# Patient Record
Sex: Male | Born: 1965 | Race: White | Hispanic: No | Marital: Married | Smoking: Never smoker
Health system: Southern US, Community
[De-identification: ages and names within clinical notes are randomized; demographics above are authoritative.]

## PROBLEM LIST (undated history)

## (undated) DIAGNOSIS — E119 Type 2 diabetes mellitus without complications: Secondary | ICD-10-CM

## (undated) DIAGNOSIS — I10 Essential (primary) hypertension: Secondary | ICD-10-CM

## (undated) DIAGNOSIS — T7840XA Allergy, unspecified, initial encounter: Secondary | ICD-10-CM

## (undated) HISTORY — DX: Allergy, unspecified, initial encounter: T78.40XA

## (undated) HISTORY — PX: COLON SURGERY: SHX602

## (undated) HISTORY — PX: SMALL INTESTINE SURGERY: SHX150

---

## 1998-04-20 ENCOUNTER — Emergency Department (HOSPITAL_COMMUNITY): Admission: EM | Admit: 1998-04-20 | Discharge: 1998-04-20 | Payer: Self-pay

## 2004-03-22 DIAGNOSIS — G4730 Sleep apnea, unspecified: Secondary | ICD-10-CM

## 2004-03-22 DIAGNOSIS — Q430 Meckel's diverticulum (displaced) (hypertrophic): Secondary | ICD-10-CM

## 2004-03-22 HISTORY — DX: Meckel's diverticulum (displaced) (hypertrophic): Q43.0

## 2004-03-22 HISTORY — DX: Sleep apnea, unspecified: G47.30

## 2006-02-11 ENCOUNTER — Encounter (INDEPENDENT_AMBULATORY_CARE_PROVIDER_SITE_OTHER): Payer: Self-pay | Admitting: Specialist

## 2006-02-11 ENCOUNTER — Inpatient Hospital Stay (HOSPITAL_COMMUNITY): Admission: EM | Admit: 2006-02-11 | Discharge: 2006-02-17 | Payer: Self-pay | Admitting: Emergency Medicine

## 2007-01-30 ENCOUNTER — Emergency Department (HOSPITAL_COMMUNITY): Admission: EM | Admit: 2007-01-30 | Discharge: 2007-01-31 | Payer: Self-pay | Admitting: Emergency Medicine

## 2007-02-03 ENCOUNTER — Ambulatory Visit: Payer: Self-pay | Admitting: Emergency Medicine

## 2007-03-08 ENCOUNTER — Ambulatory Visit: Payer: Self-pay | Admitting: Emergency Medicine

## 2007-03-08 DIAGNOSIS — J309 Allergic rhinitis, unspecified: Secondary | ICD-10-CM | POA: Insufficient documentation

## 2007-03-08 DIAGNOSIS — G4710 Hypersomnia, unspecified: Secondary | ICD-10-CM | POA: Insufficient documentation

## 2007-03-08 DIAGNOSIS — G4730 Sleep apnea, unspecified: Secondary | ICD-10-CM

## 2007-03-08 DIAGNOSIS — R05 Cough: Secondary | ICD-10-CM

## 2007-03-29 ENCOUNTER — Ambulatory Visit (HOSPITAL_BASED_OUTPATIENT_CLINIC_OR_DEPARTMENT_OTHER): Admission: RE | Admit: 2007-03-29 | Discharge: 2007-03-29 | Payer: Self-pay | Admitting: Emergency Medicine

## 2007-03-29 ENCOUNTER — Ambulatory Visit: Payer: Self-pay | Admitting: Pulmonary Disease

## 2007-03-29 ENCOUNTER — Encounter: Payer: Self-pay | Admitting: Emergency Medicine

## 2007-04-19 ENCOUNTER — Ambulatory Visit: Payer: Self-pay | Admitting: Emergency Medicine

## 2007-04-27 ENCOUNTER — Encounter: Payer: Self-pay | Admitting: Emergency Medicine

## 2007-04-27 ENCOUNTER — Ambulatory Visit: Payer: Self-pay | Admitting: Pulmonary Disease

## 2007-04-27 ENCOUNTER — Ambulatory Visit (HOSPITAL_BASED_OUTPATIENT_CLINIC_OR_DEPARTMENT_OTHER): Admission: RE | Admit: 2007-04-27 | Discharge: 2007-04-27 | Payer: Self-pay | Admitting: Emergency Medicine

## 2007-05-03 ENCOUNTER — Encounter: Payer: Self-pay | Admitting: Emergency Medicine

## 2007-07-03 ENCOUNTER — Encounter: Payer: Self-pay | Admitting: Emergency Medicine

## 2007-08-01 ENCOUNTER — Encounter: Payer: Self-pay | Admitting: Emergency Medicine

## 2007-11-28 IMAGING — CR DG CHEST 2V
2 series · 2 of 2 positions shown · non-contrast
Comparison: None.

CLINICAL DATA: Shortness of breath and syncope.
 CHEST - 2 VIEW:

[w chest pa]
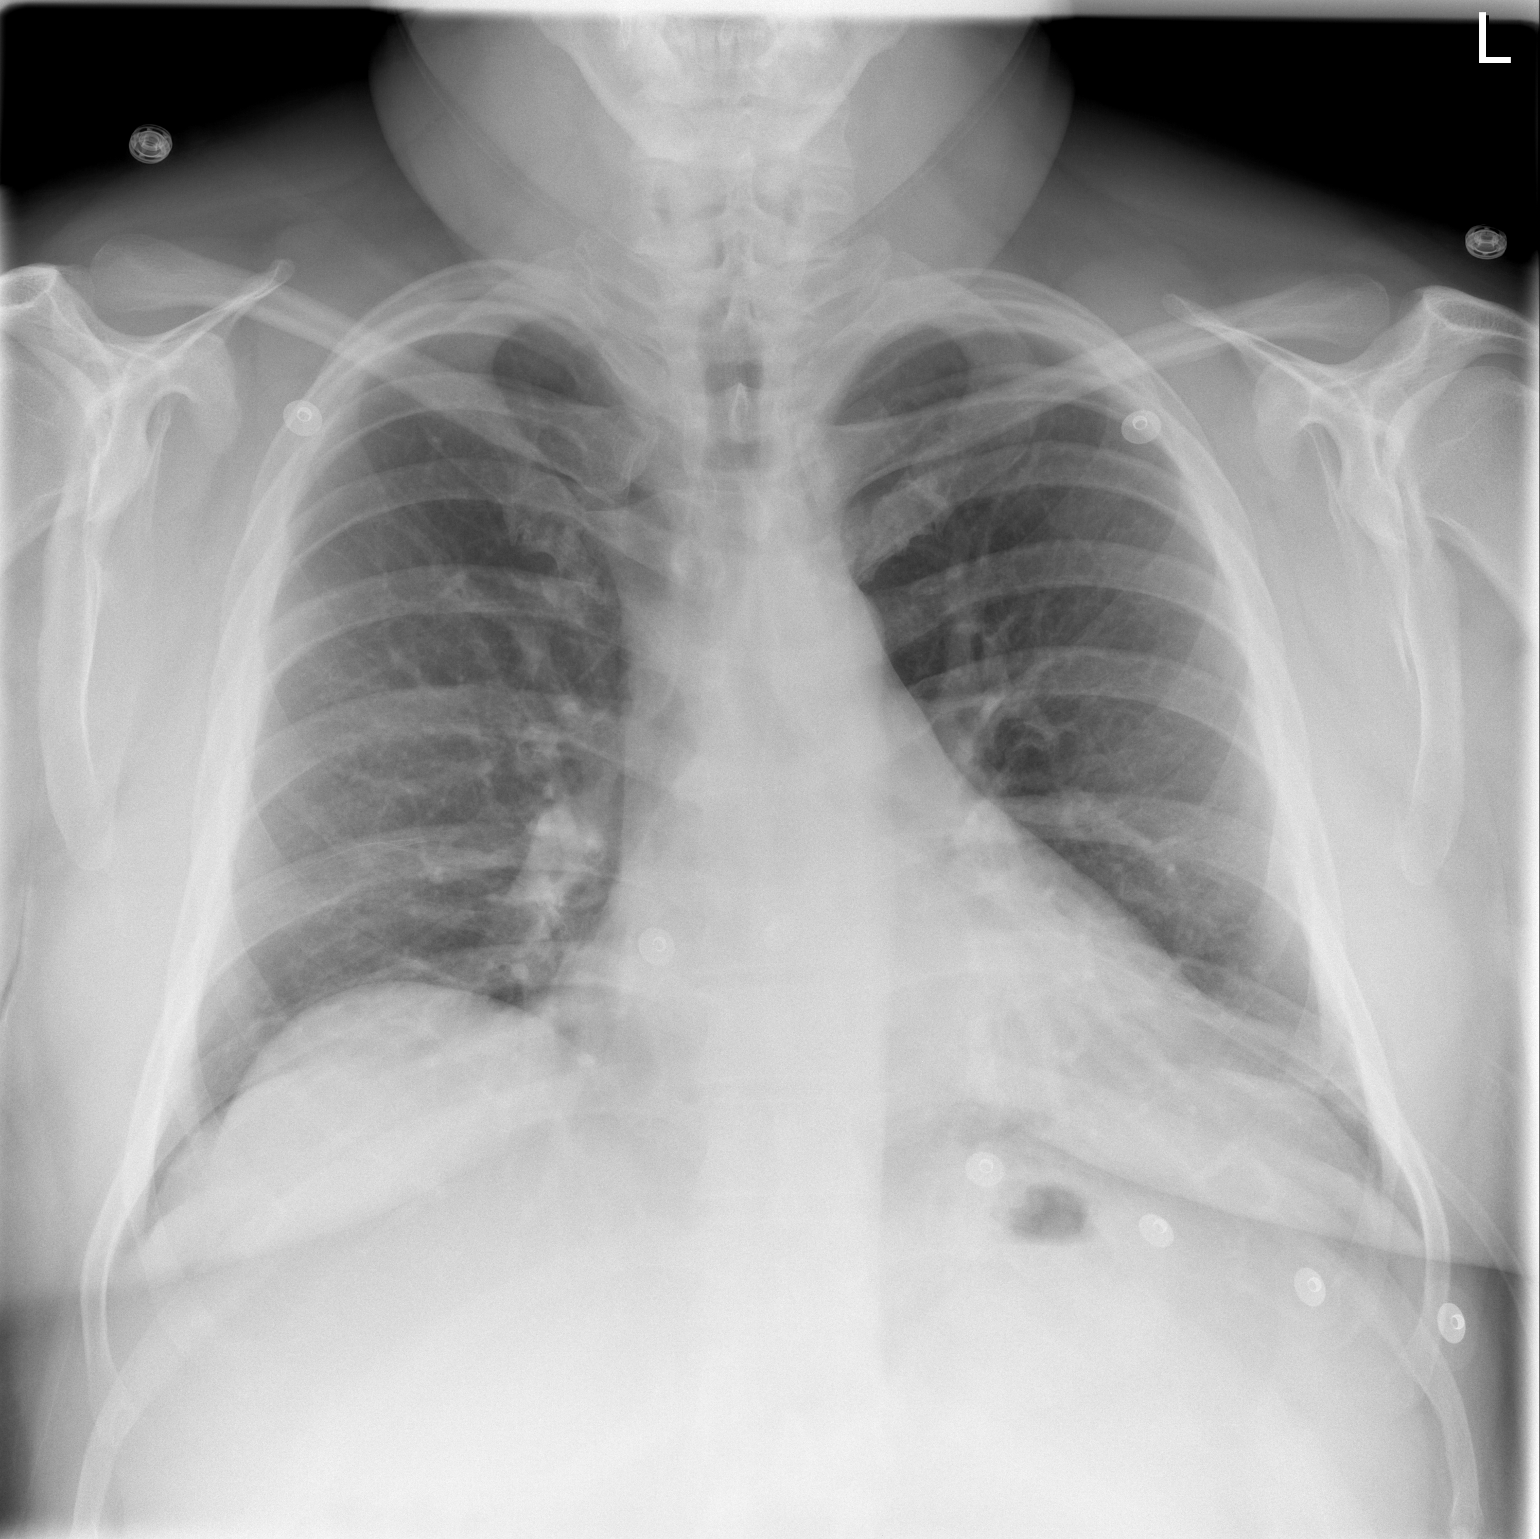

[w chest lat]
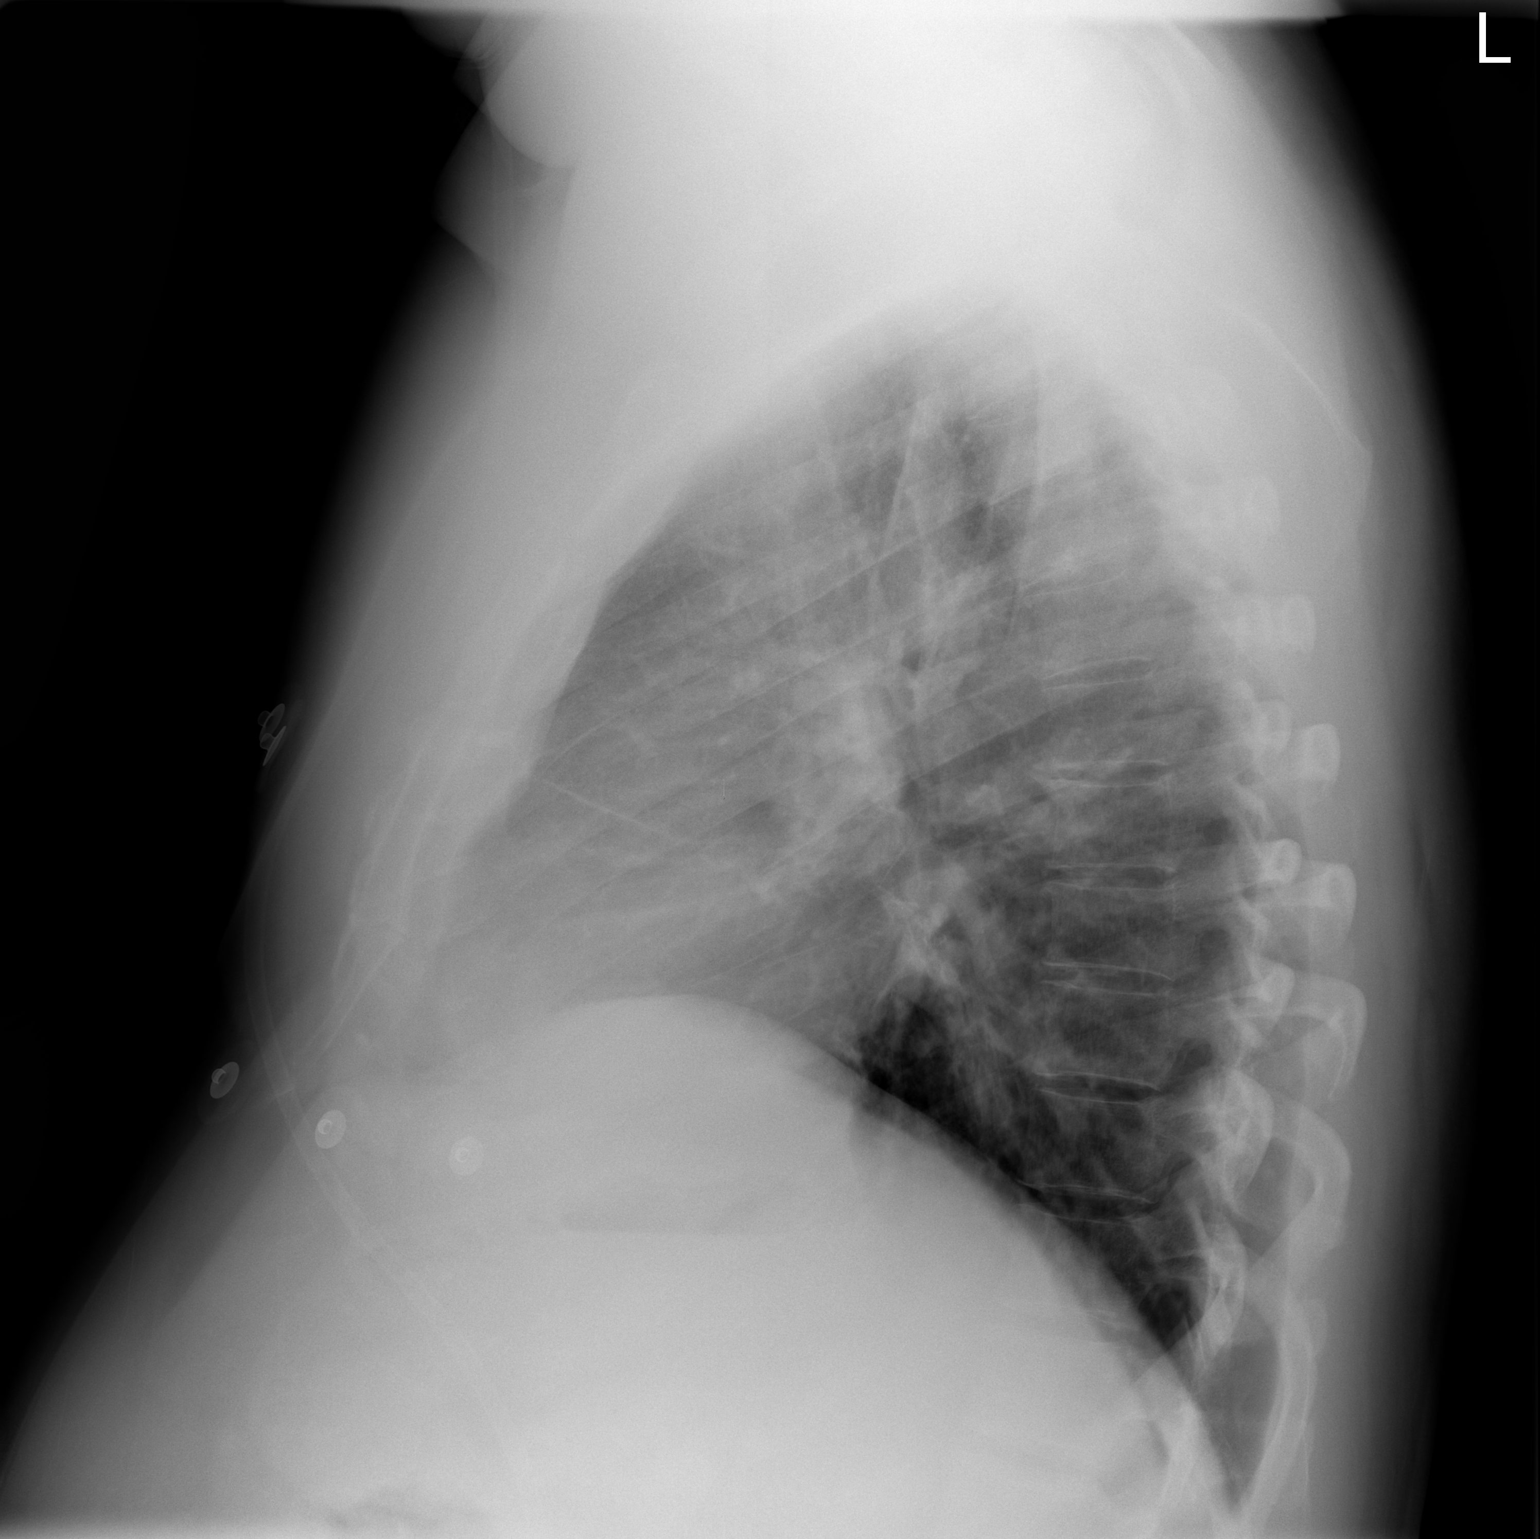

[2 of 2 positions shown; findings below may reference images not displayed]

FINDINGS: Lung volumes are low with mild basilar atelectasis.  No pleural effusion.  Heart size normal.  No focal bony abnormality.
IMPRESSION: Mild bibasilar atelectasis.  Otherwise negative.

## 2009-09-15 ENCOUNTER — Emergency Department (HOSPITAL_COMMUNITY): Admission: EM | Admit: 2009-09-15 | Discharge: 2009-09-15 | Payer: Self-pay | Admitting: Emergency Medicine

## 2010-06-07 LAB — CBC
HCT: 46 % (ref 39.0–52.0)
MCH: 27.3 pg (ref 26.0–34.0)
Platelets: 206 10*3/uL (ref 150–400)
RBC: 5.63 MIL/uL (ref 4.22–5.81)
WBC: 11.6 10*3/uL — ABNORMAL HIGH (ref 4.0–10.5)

## 2010-06-07 LAB — POCT CARDIAC MARKERS
Myoglobin, poc: 141 ng/mL (ref 12–200)
Myoglobin, poc: 80.2 ng/mL (ref 12–200)
Troponin i, poc: <0.05 ng/mL (ref 0.00–0.09)

## 2010-06-07 LAB — POCT I-STAT, CHEM 8
Calcium, Ion: 1.11 mmol/L — ABNORMAL LOW (ref 1.12–1.32)
HCT: 47 % (ref 39.0–52.0)

## 2010-08-04 NOTE — Procedures (Signed)
Randall Olson, Randall Olson                ACCOUNT NO.:  [id number]   MEDICAL RECORD NO.:  [id number]          PATIENT TYPE:  OUT   LOCATION:  SLEEP CENTER                 FACILITY:  [facility]   PHYSICIAN:  Coralyn Helling, MD        DATE OF BIRTH:  September 21, 1965   DATE OF STUDY:  03/29/2007                            NOCTURNAL POLYSOMNOGRAM   REFERRING PHYSICIAN:  Leslye Peer, MD   INDICATION FOR STUDY:  This is an individual who has a history of  obesity, sleep disruption, and excessive daytime sleepiness.  He is  referred to the sleep lab for evaluation of hypersomnia with obstructive  sleep apnea.   Height is 68 inches, weight is 285 pounds.  BMI is 54.  Neck size is  18.5 inches.   EPWORTH SLEEPINESS SCORE:  14   MEDICATIONS:   SLEEP ARCHITECTURE:  Total sleep period was 412 minutes.  Total sleep  time was 382 minutes.  Sleep efficiency was 91%.  Sleep latency was 8  minutes which is reduced.  REM latency was 106 minutes.  The study was  notable for the lack of slow wave sleep.  The patient slept in both the  supine and non-supine position.   RESPIRATORY DATA:  The average respiratory rate was 16.  The overall  apnea/hypopnea index was 30.  The events were exclusively obstructive in  nature.  The supine apnea/hypopnea index was 45.  The non-supine  apnea/hypopnea index was 18.  The REM apnea/hypopnea index was 49.  The  non-REM apnea/hypopnea index was 27.  Loud snoring was noted by the  technician.   OXYGEN DATA:  The baseline oxygenation was 95%.  The oxygen saturation  nadir was 77%.  The patient spent a total of 62 minutes with an oxygen  saturation less than 90% and 13 minutes with an oxygen saturation less  than 88%.   CARDIAC DATA:  The average heart rate was 63 and the rhythm strip showed  normal sinus rhythm.   MOVEMENT-PARASOMNIA:  The periodic limb movement index was zero.  The  patient had one restroom trip.   IMPRESSIONS-RECOMMENDATIONS:  This study shows evidence  for severe  obstructive sleep apnea as demonstrated by an apnea/hypopnea index of 30  with an oxygen saturation nadir of 77%.  The patient did have  evidence for REM related and positional worsening of his sleep apnea.  In addition to diet, exercise, and weight reduction, a strong  consideration should be given to having the patient undergo CPAP  therapy.      Coralyn Helling, MD  Diplomat, American Board of Sleep Medicine  Electronically Signed     VS/MEDQ  D:  04/30/2007 13:13:54  T:  05/01/2007 13:52:23  Job:  [id number]   cc:   Leslye Peer, MD  [address]  [place], [place] [address]

## 2010-08-04 NOTE — Procedures (Signed)
Randall Olson, Randall Olson                ACCOUNT NO.:  [id number]   MEDICAL RECORD NO.:  [id number]          PATIENT TYPE:  OUT   LOCATION:  SLEEP CENTER                 FACILITY:  [facility]   PHYSICIAN:  Coralyn Helling, MD        DATE OF BIRTH:  06/17/1965   DATE OF STUDY:  04/27/2007                            NOCTURNAL POLYSOMNOGRAM   REFERRING PHYSICIAN:  Leslye Peer, MD   REFERRING PHYSICIAN:  Dr. Levy Pupa   VITAL SIGNS:  Height is 5 feet 8 inches tall.  Weight is 285 pounds.  BMI is 43.  Neck size is 18.5 inches.   INDICATION FOR STUDY:  This is an individual who has had an overnight  polysomnogram done on March 29, 2007.  This showed severe obstructive  sleep apnea with an apnea-hypopnea index of 30.  He had an oxygen  saturation nadir of 77%.  He returns to the Sleep Lab for a CPAP  titration study.   EPWORTH SLEEPINESS SCORE:  13.   MEDICATIONS:  1. Claritin.  2. Prilosec.  3. Nasacort.   SLEEP ARCHITECTURE:  Total sleep period was 314 minutes.  Total sleep  time was 366 minutes.  Sleep efficiency is 87%.  Sleep latency is 6.5  minutes, which is reduced.  REM latency is 96.5 minutes.  The study was  notable for a lack of slow wave sleep.  The patient slept predominantly  in the supine position.   RESPIRATORY DATA:  The average respiratory rate was 15.  The patient was  titrated from a CPAP pressure setting of 6 to 12 cm of water. At a CPAP  pressure setting of 12 cm of water the apnea-hypopnea index was reduced  to 0.7.  At this pressure setting the patient was observed in both REM  sleep and supine sleep and snoring was eliminated.   OXYGEN DATA:  The baseline oxygenation was 95%.  The oxygen saturation  nadir was 87%.  At a CPAP pressure setting of 12 the lowest oxygen  saturation was 90%.   CARDIAC DATA:  The average heart rate was 68 and the rhythm strip showed  normal sinus rhythm with occasional PVCs.   MOVEMENT-PARASOMNIA:  The periodic limb movement index  was 0.  The  patient had no restroom trips.   IMPRESSIONS-RECOMMENDATIONS:  This is a CPAP titration study.  At a CPAP  pressure setting of 12 cm of water the apnea-hypopnea index was reduced  to 0.7.  At this pressure setting the patient was observed in REM sleep,  supine sleep and snoring was eliminated.      Coralyn Helling, MD  Diplomat, American Board of Sleep Medicine  Electronically Signed     VS/MEDQ  D:  04/30/2007 13:25:51  T:  05/01/2007 13:38:11  Job:  [id number]   cc:   Leslye Peer, MD  [address]  [place], [place] [address]

## 2010-08-06 ENCOUNTER — Emergency Department (HOSPITAL_COMMUNITY): Payer: BC Managed Care – PPO

## 2010-08-06 ENCOUNTER — Emergency Department (HOSPITAL_COMMUNITY)
Admission: EM | Admit: 2010-08-06 | Discharge: 2010-08-06 | Disposition: A | Discharge status: Home / Self Care | Payer: BC Managed Care – PPO | Attending: Emergency Medicine | Admitting: Emergency Medicine

## 2010-08-06 DIAGNOSIS — R109 Unspecified abdominal pain: Secondary | ICD-10-CM | POA: Insufficient documentation

## 2010-08-06 DIAGNOSIS — R3911 Hesitancy of micturition: Secondary | ICD-10-CM | POA: Insufficient documentation

## 2010-08-06 DIAGNOSIS — N201 Calculus of ureter: Secondary | ICD-10-CM | POA: Insufficient documentation

## 2010-08-06 DIAGNOSIS — R070 Pain in throat: Secondary | ICD-10-CM | POA: Insufficient documentation

## 2010-08-06 LAB — URINALYSIS, ROUTINE W REFLEX MICROSCOPIC
Glucose, UA: NEGATIVE mg/dL
Ketones, ur: 15 mg/dL — AB
Leukocytes, UA: NEGATIVE
Nitrite: NEGATIVE
Protein, ur: 100 mg/dL — AB
Specific Gravity, Urine: 1.032 — ABNORMAL HIGH (ref 1.005–1.030)
Urobilinogen, UA: 0.2 mg/dL (ref 0.0–1.0)
pH: 5.5 (ref 5.0–8.0)

## 2010-08-06 LAB — URINE MICROSCOPIC-ADD ON

## 2010-08-07 NOTE — Discharge Summary (Signed)
Randall Olson, Randall Olson                ACCOUNT NO.:  [id number]   MEDICAL RECORD NO.:  [id number]          PATIENT TYPE:  INP   LOCATION:  1611                         FACILITY:  [facility]   PHYSICIAN:  Maisie Fus A. Cornett, M.D.DATE OF BIRTH:  1966/02/23   DATE OF ADMISSION:  02/11/2006  DATE OF DISCHARGE:  02/17/2006                               DISCHARGE SUMMARY   ADMITTING DIAGNOSES:  Meckel's diverticulitis with small bowel  obstruction.   POSTOPERATIVE DIAGNOSIS:  Meckel's diverticulitis with small bowel  obstruction.   PROCEDURE:  Diagnostic laparoscopy with a small bowel resection and  Meckel's diverticulum with anastomosis.   BRIEF HISTORY:  The patient is a 45 year old male admitted with Meckel's  diverticulitis on February 11, 2006.  He was brought to the operating  room on that day for an exploratory laparoscopy and subsequent  laparotomy for resection of an inflamed Meckel's diverticulum with  primary anastomosis.   HOSPITAL COURSE:  The patient's hospital course was complicated by an  ileus the first three postoperative days.  A nasogastric tube was in  place and was removed by postoperative day #3 to day #4.  His bowel  function returned at that point.  His diet was advanced.  He was  discharged home on postoperative day #6 after tolerating a diet.  He had  some mild erythema of his incision but this was monitored and he was on  Mefoxin for that.  Otherwise, his course was unremarkable.   DISCHARGE INSTRUCTIONS:  The patient will followup next week to have his  staples removed.  He will resume a soft diet.  He will refrain from  lifting anything above 20 pounds and not drive until I see him back in  the office next week.  Ambulation was encouraged.  He was told to  shower.   CONDITION AT DISCHARGE:  Improved.      Thomas A. Cornett, M.D.  Electronically Signed     TAC/MEDQ  D:  02/17/2006  T:  02/18/2006  Job:  [id number]

## 2010-08-07 NOTE — Op Note (Signed)
Randall Olson, Randall Olson                ACCOUNT NO.:  [id number]   MEDICAL RECORD NO.:  [id number]          PATIENT TYPE:  INP   LOCATION:  0098                         [facility]ACILITY:  [facility]   PHYSICIAN:  Clovis Pu. Cornett, M.D.DATE OF BIRTH:  05-08-1965   DATE OF PROCEDURE:  02/11/2006  DATE OF DISCHARGE:                                 OPERATIVE REPORT   PREOPERATIVE DIAGNOSIS:  Meckel's diverticulitis.   POSTOPERATIVE DIAGNOSIS:  Meckel's diverticulitis with concomitant small  bowel obstruction, obstructed and inflamed Meckel's diverticulum.   PROCEDURE:  1. Exploratory laparoscopy.  2. Exploratory laparotomy for a section of Meckel's diverticulum.   SURGEON:  Maisie Fus A. Cornett, M.D.   ANESTHESIA:  General endotracheal anesthesia with 0.25% Sensorcaine local.   ESTIMATED BLOOD LOSS:  60 cc.   SPECIMENS:  Meckel's diverticulum to pathology.   DRAINS:  None.   INDICATIONS FOR PROCEDURE:  The patient is a 45 year old male with a two day  history of progressive right lower quadrant pain associated with nausea,  vomiting, and bloating.  He had eaten some coconut two days ago and has  since become obstipated.  He was seen today in the emergency room where a CT  scan revealed an inflamed, impacted Meckel's diverticulum by CT scan.  He  had a white count of 19,000 with a left shift.  On examination, he was  tender in his right lower quadrant.  He was brought to the operating room  for support for a laparoscopy and potential exploratory laparotomy,  depending on what was found.  The procedure was discussed with the patient's  family at his bedside.  They voiced understanding and agreed to proceed.   DESCRIPTION OF PROCEDURE:  Patient was brought to the operating room and  placed supine.  The left arm was tucked.  The abdomen was prepped and draped  in a sterile fashion.  A nasogastric tube was placed, and a Foley catheter  was placed.  The abdomen was prepped and draped in a sterile  fashion.  An  incision was made just below the umbilicus at about 1 cm.  Dissection was  carried down to his fascia.  His fascia was opened in the midline.  I placed  my finger into the abdominal cavity without difficulty.  Purse-string suture  of 0 Vicryl was placed at the fascia, and a 12 mm Hasson cannula was placed  under direct vision.  A pneumoperitoneum was created to 15 mmHg of CO2 and a  laparoscope was placed.  Upon inspection, the patient had significant small  bowel dilatation with a decompressed ileum.  Two 5 mm ports were placed, one  in the left lower quadrant and the second in the midline between the  umbilicus and the pubic symphysis.  Graspers were used, which were  atraumatic, to run the small bowel.  We found the cecum and appendix and  took a picture of this, and this did not appear inflamed.  The ileum  appeared relatively decompressed.  I was able to run the small bowel back  until I encountered a large, inflamed Meckel's diverticulum.  I  placed a  grasper on it.  Next, I removed the CO2 and made a lower midline incision  the size of my hand.  I was able to place my hand through the incision,  grabbed the Meckel's diverticulum, and pulled this out.  The bowel was  significantly obstructed proximal to it, and the Meckel's was impacted with  material.  It was acutely inflamed.  Distal to this, the bowel was  significantly decompressed and noninflamed.  A GIA stapler was used, and the  proximal end of the bowel just proximal to the Meckel's was divided.  A  second GIA stapling device was used to divide the bowel distal to this.  The  Ligasure was used to take down the mesentery, and the Meckel's was resected.  A side-to-side functional end-to-end anastomosis was then constructed using  a GIA 75 stapling device as well as TA 60 stapling device.  Suture lines  were then over-sewn with 3-0 silk.  The anastomosis was widely patent.  Prior to closing the enterotomy, the  staple line appeared hemostatic.  The  tissue was viable with good bleeding.  I then closed the mesentery defect  with 3-0 silk.  There was some bleeding from a small vessel in the  mesentery, controlled with a large clip.   At this point in time, we irrigated out the subcutaneous tissues in the  abdomen, and I placed the anastomosis back into the abdominal cavity.  All  excess fluid was suctioned out.  I  then closed the fascia with a #1 PDS.  Skin staples were used to close all skin.  The one port was removed prior to  closing the abdomen.  All final counts of sponge, needle, and instruments  were found to be correct at this portion of the case.  The patient was  awakened and taken to recovery in satisfactory condition.      Thomas A. Cornett, M.D.  Electronically Signed     TAC/MEDQ  D:  02/11/2006  T:  02/11/2006  Job:  [id number]

## 2010-08-07 NOTE — Consult Note (Signed)
Randall Olson, Randall Olson                ACCOUNT NO.:  [id number]   MEDICAL RECORD NO.:  [id number]          PATIENT TYPE:  INP   LOCATION:  0098                         FACILITY:  [facility]   PHYSICIAN:  Clovis Pu. Cornett, M.D.DATE OF BIRTH:  1966-03-06   DATE OF CONSULTATION:  02/11/2006  DATE OF DISCHARGE:                                   CONSULTATION   REASON FOR CONSULTATION:  Right lower quadrant abdominal pain.   HISTORY OF PRESENT ILLNESS:  The patient is a 45 year old male with a 2 day  history of moderate to severe right lower quadrant pain.  The pain is sharp  in nature with radiation to his back.  It has been progressively worsening  since Wednesday evening when he ate some coconut.  He is also having  problems with moving his bowels with some constipation and questionable  obstipation.  He has not had any significant vomiting recently, but he has  had some vomiting and bloating.  The pain seems to be worsening since  Wednesday and pain medicine has made it better; otherwise, nothing else has  made it better.  I was asked to see the patient at the request of Dr. Denton Lank  in consultation for right lower quadrant pain.   PAST MEDICAL HISTORY:  None.   PAST SURGICAL HISTORY:  None.   FAMILY HISTORY:  Positive for Crohn's disease in his mother.   SOCIAL HISTORY:  Negative for tobacco use, does occasionally use alcohol.  He is married.  He works here in [place].   ALLERGIES TO MEDICATIONS:  NONE.   MEDICATIONS:  None.   REVIEW OF SYSTEMS:  Fifteen point review of systems is otherwise negative  except for that stated above.   PHYSICAL EXAMINATION:  Temperature 97.6, pulse is 94, blood pressure 121/77.  GENERAL APPEARANCE:  A pleasant male in no apparent distress.  HEENT:  Extraocular movements are intact.  No evidence of scleral icterus.  Oropharynx is moist.  Dentition normal.  NECK:  No JVD noted.  Trachea is midline, there is no evidence of mass.  CHEST:  Chest wall  motion is normal.  LUNGS:  Clear to auscultation bilaterally.  CARDIOVASCULAR:  Regular rate and rhythm without rub, murmur or gallop.  Peripheral pulses are present.  Feet are warm and well perfused.  ABDOMEN:  Tenderness in the right lower quadrant just to the right of the  umbilicus and toward the right anterior superior iliac spine.  There is mild  rebounding, mild guarding noted in that area.  No mass.  No hernia.  EXTREMITIES:  Muscle tone is normal, range of motion is normal.  NEUROLOGICAL EXAMINATION:  Motor and sensory function are grossly intact.  Cranial nerves II-XII are intact.   DIAGNOSTIC STUDIES:  I reviewed his CT scan of his abdomen and pelvis which  shows an impacted Meckel's diverticulum with stool and peridiverticular  inflammation.  His appendix appears normal.  There is a partial small bowel  obstruction as well from this.  He has a white count of 19,800 with a  positive left shift.  Hemoglobin is 17, hematocrit  50, platelet count is  263,000, urinalysis is unremarkable.  Sodium 138,  potassium 4.1, chloride  103, CO2 26, BUN 13, creatinine 1, glucose 148.  Liver function studies are  within normal limits.  Amylase and lipase are normal.   IMPRESSION:  Acute Meckel's diverticulitis with possible partial small bowel  obstruction.  From this, plan at this point in time, I feel that a surgical  exploration is warranted since he has leukocytosis, tenderness and an  inflamed Meckel's diverticulum by CT scanning.  I recommend initial  laparoscopy first to localize this as well as to examine the appendix.  He  will then require probably a small laparotomy to resect this and may require  bowel resection with anastomosis.  I have discussed this with the patient  and his family who is at his bedside.  Risks of the procedure were discussed  with him.  They include bleeding, infection, bowel anastomotic leakage,  bowel obstruction, cardiovascular risk, deep venous thrombosis,  myocardial  infarction, etc.  These have been discussed with the patient, he understands  and agrees to proceed.  We will head to the operating room as soon as a room  is available.      Thomas A. Cornett, M.D.  Electronically Signed     TAC/MEDQ  D:  02/11/2006  T:  02/11/2006  Job:  [id number]   cc:   Trudi Ida. Denton Lank, M.D.  Fax: [phone]

## 2010-09-16 ENCOUNTER — Emergency Department (HOSPITAL_COMMUNITY): Payer: BC Managed Care – PPO

## 2010-09-16 ENCOUNTER — Emergency Department (HOSPITAL_COMMUNITY)
Admission: EM | Admit: 2010-09-16 | Discharge: 2010-09-16 | Disposition: A | Discharge status: Home / Self Care | Payer: BC Managed Care – PPO | Attending: Emergency Medicine | Admitting: Emergency Medicine

## 2010-09-16 DIAGNOSIS — R110 Nausea: Secondary | ICD-10-CM | POA: Insufficient documentation

## 2010-09-16 DIAGNOSIS — R109 Unspecified abdominal pain: Secondary | ICD-10-CM | POA: Insufficient documentation

## 2010-09-16 DIAGNOSIS — N200 Calculus of kidney: Secondary | ICD-10-CM | POA: Insufficient documentation

## 2010-09-16 LAB — URINALYSIS, ROUTINE W REFLEX MICROSCOPIC
Protein, ur: NEGATIVE mg/dL
Specific Gravity, Urine: 1.022 (ref 1.005–1.030)
Urobilinogen, UA: 0.2 mg/dL (ref 0.0–1.0)

## 2010-09-16 LAB — URINE MICROSCOPIC-ADD ON

## 2010-12-29 LAB — DIFFERENTIAL
Basophils Relative: 0
Eosinophils Relative: 2
Lymphocytes Relative: 19
Lymphs Abs: 2.1
Monocytes Absolute: 0.7
Neutrophils Relative %: 73

## 2010-12-29 LAB — I-STAT 8, (EC8 V) (CONVERTED LAB)
Bicarbonate: 23.8
Glucose, Bld: 108 — ABNORMAL HIGH
HCT: 45
Hemoglobin: 15.3
TCO2: 25
pCO2, Ven: 36.7 — ABNORMAL LOW

## 2010-12-29 LAB — POCT CARDIAC MARKERS
Operator id: 294341
Troponin i, poc: <0.05

## 2010-12-29 LAB — D-DIMER, QUANTITATIVE: D-Dimer, Quant: 0.3

## 2010-12-29 LAB — CBC
MCHC: 33.1
MCV: 79.2
RBC: 5.46
WBC: 10.9 — ABNORMAL HIGH

## 2010-12-29 LAB — POCT I-STAT CREATININE
Creatinine, Ser: 0.9
Operator id: 294341

## 2015-07-23 ENCOUNTER — Ambulatory Visit
Admission: RE | Admit: 2015-07-23 | Discharge: 2015-07-23 | Disposition: A | Discharge status: Home / Self Care | Payer: BLUE CROSS/BLUE SHIELD | Source: Ambulatory Visit | Attending: Family Medicine | Admitting: Family Medicine

## 2015-07-23 ENCOUNTER — Other Ambulatory Visit: Payer: Self-pay | Admitting: Family Medicine

## 2015-07-23 DIAGNOSIS — M25511 Pain in right shoulder: Secondary | ICD-10-CM

## 2015-08-04 ENCOUNTER — Other Ambulatory Visit: Payer: Self-pay | Admitting: Family Medicine

## 2015-08-04 DIAGNOSIS — M25511 Pain in right shoulder: Secondary | ICD-10-CM

## 2015-08-08 ENCOUNTER — Other Ambulatory Visit: Payer: BLUE CROSS/BLUE SHIELD

## 2016-03-22 DIAGNOSIS — F419 Anxiety disorder, unspecified: Secondary | ICD-10-CM

## 2016-03-22 HISTORY — DX: Anxiety disorder, unspecified: F41.9

## 2016-05-02 ENCOUNTER — Encounter (HOSPITAL_COMMUNITY): Payer: Self-pay

## 2016-05-02 ENCOUNTER — Emergency Department (HOSPITAL_COMMUNITY)
Admission: EM | Admit: 2016-05-02 | Discharge: 2016-05-02 | Disposition: A | Discharge status: Home / Self Care | Payer: BLUE CROSS/BLUE SHIELD | Attending: Emergency Medicine | Admitting: Emergency Medicine

## 2016-05-02 DIAGNOSIS — Y929 Unspecified place or not applicable: Secondary | ICD-10-CM | POA: Insufficient documentation

## 2016-05-02 DIAGNOSIS — W208XXA Other cause of strike by thrown, projected or falling object, initial encounter: Secondary | ICD-10-CM | POA: Insufficient documentation

## 2016-05-02 DIAGNOSIS — Y9389 Activity, other specified: Secondary | ICD-10-CM | POA: Insufficient documentation

## 2016-05-02 DIAGNOSIS — E119 Type 2 diabetes mellitus without complications: Secondary | ICD-10-CM | POA: Diagnosis not present

## 2016-05-02 DIAGNOSIS — S01512A Laceration without foreign body of oral cavity, initial encounter: Secondary | ICD-10-CM | POA: Diagnosis present

## 2016-05-02 DIAGNOSIS — S0181XA Laceration without foreign body of other part of head, initial encounter: Secondary | ICD-10-CM

## 2016-05-02 DIAGNOSIS — Y990 Civilian activity done for income or pay: Secondary | ICD-10-CM | POA: Insufficient documentation

## 2016-05-02 HISTORY — DX: Type 2 diabetes mellitus without complications: E11.9

## 2016-05-02 MED ORDER — BACITRACIN ZINC 500 UNIT/GM EX OINT
TOPICAL_OINTMENT | CUTANEOUS | Status: DC | PRN
  Filled 2016-05-02: qty 0.9

## 2016-05-02 MED ORDER — LIDOCAINE-EPINEPHRINE (PF) 2 %-1:200000 IJ SOLN
10.0000 mL | Freq: Once | INTRAMUSCULAR | Status: AC
  Administered 2016-05-02: 10 mL
  Filled 2016-05-02: qty 20

## 2016-05-02 NOTE — Discharge Instructions (Signed)
Keep the wound clean.  Have sutures removed in 5 days.

## 2016-05-02 NOTE — ED Triage Notes (Signed)
States tool hit him in right side of face at base of right nose area no active bleeding noted no LOC voiced.

## 2016-05-02 NOTE — ED Provider Notes (Signed)
[facility] DEPT Provider Note   CSN: [id number] Arrival date & time: 05/02/16  [id number]     History   Chief Complaint Chief Complaint  Patient presents with  . Laceration    tool hit him in face with laceration at base of right nostril area.    HPI Randall Olson is a 51 y.o. male.  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 Denies dental or other trauma. Last tetanus immunization 3 years ago. There are no other known modifying factors.   HPI  Past Medical History:  Diagnosis Date  . Diabetes mellitus without complication [facility])     Patient Active Problem List   Diagnosis Date Noted  . ALLERGIC RHINITIS 03/08/2007  . HYPERSOMNIA WITH SLEEP APNEA UNSPECIFIED 03/08/2007  . COUGH 03/08/2007    Past Surgical History:  Procedure Laterality Date  . SMALL INTESTINE SURGERY         Home Medications    Prior to Admission medications   Not on File    Family History History reviewed. No pertinent family history.  Social History Social History  Substance Use Topics  . Smoking status: Never Smoker  . Smokeless tobacco: Never Used  . Alcohol use No     Allergies   Patient has no allergy information on record.   Review of Systems Review of Systems  All other systems reviewed and are negative.    Physical Exam Updated Vital Signs BP 153/87 (BP Location: Left Arm)   Pulse 82   Temp 97.6 F (36.4 C) (Oral)   Resp 20   Ht 5\' 8"  (1.727 m)   Wt 286 lb (129.7 kg)   SpO2 97%   BMI 43.49 kg/m   Physical Exam  Constitutional: He is oriented to person, place, and time. He appears well-developed and well-nourished. No distress.  HENT:  Head: Normocephalic.  Right Ear: External ear normal.  Left Ear: External ear normal.  Gaping laceration right upper lip, with mild bleeding. No associated dental trauma or trismus.  Eyes: Conjunctivae and EOM are normal. Pupils are equal, round, and  reactive to light.  Neck: Normal range of motion and phonation normal. Neck supple.  Cardiovascular: Normal rate and regular rhythm.   Pulmonary/Chest: Effort normal. He exhibits no bony tenderness.  Musculoskeletal: Normal range of motion.  Neurological: He is alert and oriented to person, place, and time. No cranial nerve deficit or sensory deficit. He exhibits normal muscle tone. Coordination normal.  Skin: Skin is warm, dry and intact.  Psychiatric: He has a normal mood and affect. His behavior is normal. Judgment and thought content normal.  Nursing note and vitals reviewed.    ED Treatments / Results  Labs (all labs ordered are listed, but only abnormal results are displayed) Labs Reviewed - No data to display  EKG  EKG Interpretation None       Radiology No results found.  Procedures .Marland KitchenLaceration Repair Date/Time: 05/02/2016 8:01 AM Performed by: Daleen Bo Authorized by: Daleen Bo   Consent:    Consent obtained:  Verbal   Consent given by:  Patient   Risks discussed:  Pain Anesthesia (see MAR for exact dosages):    Anesthesia method:  Local infiltration   Local anesthetic:  Lidocaine 2% w/o epi Laceration details:    Location:  Lip   Lip location:  Upper exterior lip   Length (cm):  2.5   Depth (mm):  0.5 Repair type:  Repair type:  Simple Pre-procedure details:    Preparation:  Patient was prepped and draped in usual sterile fashion Exploration:    Hemostasis achieved with:  Direct pressure   Wound exploration: wound explored through full range of motion     Wound extent: no fascia violation noted, no nerve damage noted, no underlying fracture noted and no vascular damage noted     Contaminated: no   Treatment:    Area cleansed with:  Shur-Clens   Amount of cleaning:  Standard   Irrigation solution:  Sterile saline   Visualized foreign bodies/material removed: no   Skin repair:    Repair method:  Sutures   Suture size:  5-0   Suture  material:  Prolene   Number of sutures:  3 Approximation:    Approximation:  Close   Vermilion border: well-aligned   Post-procedure details:    Dressing:  Antibiotic ointment   Patient tolerance of procedure:  Tolerated well, no immediate complications   (including critical care time)  Medications Ordered in ED Medications  bacitracin ointment (not administered)  lidocaine-EPINEPHrine (XYLOCAINE W/EPI) 2 %-1:200000 (PF) injection 10 mL (10 mLs Infiltration Given 05/02/16 0750)     Initial Impression / Assessment and Plan / ED Course  I have reviewed the triage vital signs and the nursing notes.  Pertinent labs & imaging results that were available during my care of the patient were reviewed by me and considered in my medical decision making (see chart for details).     Medications  bacitracin ointment (not administered)  lidocaine-EPINEPHrine (XYLOCAINE W/EPI) 2 %-1:200000 (PF) injection 10 mL (10 mLs Infiltration Given 05/02/16 0750)    Patient Vitals for the past 24 hrs:  BP Temp Temp src Pulse Resp SpO2 Height Weight  05/02/16 0648 153/87 97.6 F (36.4 C) Oral 82 20 97 % 5\' 8"  (1.727 m) 286 lb (129.7 kg)    8:03 AM Reevaluation with update and discussion. After initial assessment and treatment, an updated evaluation reveals No additional complaints. Findings discussed with patient, all questions answered. [name]    Final Clinical Impressions(s) / ED Diagnoses   Final diagnoses:  Facial laceration, initial encounter   Laceration of face without other injuries  Nursing Notes Reviewed/ Care Coordinated Applicable Imaging Reviewed Interpretation of Laboratory Data incorporated into ED treatment  The patient appears reasonably screened and/or stabilized for discharge and I doubt any other medical condition or other [facility] requiring further screening, evaluation, or treatment in the ED at this time prior to discharge.  Plan: Home Medications- continue; Home  Treatments- rest; return here if the recommended treatment, does not improve the symptoms; Recommended follow up- PCP prn and suture removal 5 days   New Prescriptions New Prescriptions   No medications on file     Daleen Bo, MD 05/02/16 [phone]

## 2016-06-17 ENCOUNTER — Encounter (HOSPITAL_COMMUNITY): Payer: Self-pay

## 2016-06-17 ENCOUNTER — Emergency Department (HOSPITAL_COMMUNITY): Payer: BLUE CROSS/BLUE SHIELD

## 2016-06-17 ENCOUNTER — Emergency Department (HOSPITAL_COMMUNITY)
Admission: EM | Admit: 2016-06-17 | Discharge: 2016-06-17 | Disposition: A | Discharge status: Home / Self Care | Payer: BLUE CROSS/BLUE SHIELD | Attending: Emergency Medicine | Admitting: Emergency Medicine

## 2016-06-17 DIAGNOSIS — I10 Essential (primary) hypertension: Secondary | ICD-10-CM | POA: Diagnosis not present

## 2016-06-17 DIAGNOSIS — R0602 Shortness of breath: Secondary | ICD-10-CM | POA: Insufficient documentation

## 2016-06-17 DIAGNOSIS — Z5321 Procedure and treatment not carried out due to patient leaving prior to being seen by health care provider: Secondary | ICD-10-CM | POA: Diagnosis not present

## 2016-06-17 DIAGNOSIS — E119 Type 2 diabetes mellitus without complications: Secondary | ICD-10-CM | POA: Insufficient documentation

## 2016-06-17 HISTORY — DX: Essential (primary) hypertension: I10

## 2016-06-17 LAB — BASIC METABOLIC PANEL
ANION GAP: 9 (ref 5–15)
BUN: 13 mg/dL (ref 6–20)
CHLORIDE: 100 mmol/L — AB (ref 101–111)
CO2: 26 mmol/L (ref 22–32)
Calcium: 9.1 mg/dL (ref 8.9–10.3)
Creatinine, Ser: 1.04 mg/dL (ref 0.61–1.24)
GFR calc Af Amer: >60 mL/min (ref >60)
GLUCOSE: 247 mg/dL — AB (ref 65–99)
POTASSIUM: 4 mmol/L (ref 3.5–5.1)
Sodium: 135 mmol/L (ref 135–145)

## 2016-06-17 LAB — CBC
HEMATOCRIT: 43.9 % (ref 39.0–52.0)
HEMOGLOBIN: 14.1 g/dL (ref 13.0–17.0)
MCH: 26.1 pg (ref 26.0–34.0)
MCHC: 32.1 g/dL (ref 30.0–36.0)
MCV: 81.3 fL (ref 78.0–100.0)
Platelets: 204 10*3/uL (ref 150–400)
RBC: 5.4 MIL/uL (ref 4.22–5.81)
RDW: 14.8 % (ref 11.5–15.5)
WBC: 10.8 10*3/uL — ABNORMAL HIGH (ref 4.0–10.5)

## 2016-06-17 LAB — I-STAT TROPONIN, ED: Troponin i, poc: 0 ng/mL (ref 0.00–0.08)

## 2016-06-17 NOTE — ED Notes (Signed)
Pt ambulatory up to nurse first stating he is feeling much better and will just see his doctor at work. Pt encouraged to stay and be seen by provider. resp e/u, nad.

## 2016-06-17 NOTE — ED Triage Notes (Signed)
Pt woke up from sleep on cough coughing and feeling like he was choking. HE reports he could not catch his breath so he put CPAP on and tried sitting up with little relief. At this time he feels symptoms have improved but still feels slightly labored.NAD VSS in triage. Denies cp, nv.

## 2019-04-15 ENCOUNTER — Emergency Department (HOSPITAL_COMMUNITY): Payer: BC Managed Care – PPO

## 2019-04-15 ENCOUNTER — Encounter (HOSPITAL_COMMUNITY): Payer: Self-pay | Admitting: *Deleted

## 2019-04-15 ENCOUNTER — Emergency Department (HOSPITAL_COMMUNITY)
Admission: EM | Admit: 2019-04-15 | Discharge: 2019-04-15 | Disposition: A | Discharge status: Home / Self Care | Payer: BC Managed Care – PPO | Attending: Emergency Medicine | Admitting: Emergency Medicine

## 2019-04-15 ENCOUNTER — Other Ambulatory Visit: Payer: Self-pay

## 2019-04-15 DIAGNOSIS — I10 Essential (primary) hypertension: Secondary | ICD-10-CM | POA: Diagnosis not present

## 2019-04-15 DIAGNOSIS — E119 Type 2 diabetes mellitus without complications: Secondary | ICD-10-CM | POA: Diagnosis not present

## 2019-04-15 DIAGNOSIS — R0602 Shortness of breath: Secondary | ICD-10-CM | POA: Diagnosis present

## 2019-04-15 DIAGNOSIS — R079 Chest pain, unspecified: Secondary | ICD-10-CM

## 2019-04-15 DIAGNOSIS — R0789 Other chest pain: Secondary | ICD-10-CM | POA: Diagnosis not present

## 2019-04-15 LAB — CBC WITH DIFFERENTIAL/PLATELET
Abs Immature Granulocytes: 0.06 10*3/uL (ref 0.00–0.07)
Basophils Absolute: 0 10*3/uL (ref 0.0–0.1)
Basophils Relative: 0 %
Eosinophils Absolute: 0.1 10*3/uL (ref 0.0–0.5)
Eosinophils Relative: 1 %
HCT: 43.9 % (ref 39.0–52.0)
Hemoglobin: 13.8 g/dL (ref 13.0–17.0)
Immature Granulocytes: 1 %
Lymphocytes Relative: 14 %
Lymphs Abs: 1.5 10*3/uL (ref 0.7–4.0)
MCH: 26.3 pg (ref 26.0–34.0)
MCHC: 31.4 g/dL (ref 30.0–36.0)
MCV: 83.6 fL (ref 80.0–100.0)
Monocytes Absolute: 0.7 10*3/uL (ref 0.1–1.0)
Monocytes Relative: 7 %
Neutro Abs: 8.3 10*3/uL — ABNORMAL HIGH (ref 1.7–7.7)
Neutrophils Relative %: 77 %
Platelets: 197 10*3/uL (ref 150–400)
RBC: 5.25 MIL/uL (ref 4.22–5.81)
RDW: 14.1 % (ref 11.5–15.5)
WBC: 10.7 10*3/uL — ABNORMAL HIGH (ref 4.0–10.5)
nRBC: 0 % (ref 0.0–0.2)

## 2019-04-15 LAB — TROPONIN I (HIGH SENSITIVITY): Troponin I (High Sensitivity): <2 ng/L (ref <18)

## 2019-04-15 LAB — BASIC METABOLIC PANEL
Anion gap: 10 (ref 5–15)
BUN: 15 mg/dL (ref 6–20)
CO2: 23 mmol/L (ref 22–32)
Calcium: 9.2 mg/dL (ref 8.9–10.3)
Chloride: 103 mmol/L (ref 98–111)
Creatinine, Ser: 0.94 mg/dL (ref 0.61–1.24)
GFR calc Af Amer: >60 mL/min (ref >60)
GFR calc non Af Amer: >60 mL/min (ref >60)
Glucose, Bld: 210 mg/dL — ABNORMAL HIGH (ref 70–99)
Potassium: 4.4 mmol/L (ref 3.5–5.1)
Sodium: 136 mmol/L (ref 135–145)

## 2019-04-15 NOTE — ED Notes (Signed)
The pt felt like he had an anxiety attack approx one hour ago at home  Waking up from his couch with back pain.  At present he is comforftable

## 2019-04-15 NOTE — ED Provider Notes (Signed)
[facility] EMERGENCY DEPARTMENT Provider Note   CSN: [id number] Arrival date & time: 04/15/19  0110     History Chief Complaint  Patient presents with  . Shortness of Breath    Randall Olson is a 54 y.o. male.  The history is provided by the patient and medical records.  Shortness of Breath Associated symptoms: chest pain     54 y.o. M with hx of HTN and DM, presenting to the ED after episode of chest pain.  States he was sleeping on the couch in an awkward position when he woke up and had pain in his low back.  States he was in a fair amount of pain, unable to get it resolve then started having chest pain and SOB.  No diaphoresis, nausea, vomiting, dizziness, or feelings of syncope.  States he woke up his wife who kept on eye on him for a few minutes.  States he started to feel "panicky" like he has before with anxiety attacks so took an alprazolam with some relief.  States then he started to feel heartburn so took tums and symptoms subsided.  States he feels back to [place] but wife encouraged him to get evaluated.  He denies prior cardiac history.  He is not a smoker.  Continues to feel well here in the ER.  Past Medical History:  Diagnosis Date  . Diabetes mellitus without complication ([place])   . Hypertension     Patient Active Problem List   Diagnosis Date Noted  . ALLERGIC RHINITIS 03/08/2007  . HYPERSOMNIA WITH SLEEP APNEA UNSPECIFIED 03/08/2007  . COUGH 03/08/2007    Past Surgical History:  Procedure Laterality Date  . SMALL INTESTINE SURGERY         No family history on file.  Social History   Tobacco Use  . Smoking status: Never Smoker  . Smokeless tobacco: Never Used  Substance Use Topics  . Alcohol use: No  . Drug use: No    Home Medications Prior to Admission medications   Not on File    Allergies    Patient has no known allergies.  Review of Systems   Review of Systems  Respiratory: Positive for shortness of breath.     Cardiovascular: Positive for chest pain.  Musculoskeletal: Positive for back pain.  All other systems reviewed and are negative.   Physical Exam Updated Vital Signs BP 128/76   Pulse 72   Temp 98.4 F (36.9 C) (Oral)   Resp 17   SpO2 97%   Physical Exam Vitals and nursing note reviewed.  Constitutional:      Appearance: He is well-developed.     Comments: Appears well, NAD  HENT:     Head: Normocephalic and atraumatic.  Eyes:     Conjunctiva/sclera: Conjunctivae normal.     Pupils: Pupils are equal, round, and reactive to light.  Cardiovascular:     Olson and Rhythm: Normal Olson and regular rhythm.     Heart sounds: Normal heart sounds.  Pulmonary:     Effort: Pulmonary effort is normal.     Breath sounds: Normal breath sounds. No wheezing or rhonchi.  Chest:     Comments: Chest wall non-tender Abdominal:     General: Bowel sounds are normal.     Palpations: Abdomen is soft.  Musculoskeletal:        General: Normal range of motion.     Cervical back: Normal range of motion.  Skin:    General: Skin is warm  and dry.  Neurological:     Mental Status: He is alert and oriented to person, place, and time.     ED Results / Procedures / Treatments   Labs (all labs ordered are listed, but only abnormal results are displayed) Labs Reviewed  CBC WITH DIFFERENTIAL/PLATELET - Abnormal; Notable for the following components:      Result Value   WBC 10.7 (*)    Neutro Abs 8.3 (*)    All other components within normal limits  BASIC METABOLIC PANEL - Abnormal; Notable for the following components:   Glucose, Bld 210 (*)    All other components within normal limits  TROPONIN I (HIGH SENSITIVITY)    EKG EKG Interpretation  Date/Time:  Sunday April 15 2019 01:34:34 EST Randall Olson:  73 PR Interval:    QRS Duration: 114 QT Interval:  394 QTC Calculation: 435 R Axis:   45 Text Interpretation: Sinus rhythm Borderline intraventricular conduction delay Low voltage,  precordial leads Baseline wander in lead(s) V3 No significant change since last EKG Confirmed by Orpah Greek [phone]) on 04/15/2019 4:20:00 AM   Radiology DG Chest Port 1 View  Result Date: 04/15/2019 CLINICAL DATA:  Chest pain EXAM: PORTABLE CHEST 1 VIEW COMPARISON:  Radiograph 06/17/2016 FINDINGS: No consolidation or features of edema. No visible pneumothorax or effusion the portion of the right costophrenic sulcus is collimated. Cardiac size at the upper limits of normal. Cardiomediastinal contours are otherwise unremarkable. No acute osseous or soft tissue abnormality. Degenerative changes are present in the imaged spine and shoulders. Telemetry leads overlie the chest. IMPRESSION: No acute cardiopulmonary abnormality. Electronically Signed   By: Lovena Le M.D.   On: 04/15/2019 02:48    Procedures Procedures (including critical care time)  Medications Ordered in ED Medications - No data to display  ED Course  I have reviewed the triage vital signs and the nursing notes.  Pertinent labs & imaging results that were available during my care of the patient were reviewed by me and considered in my medical decision making (see chart for details).    MDM Rules/Calculators/A&P  54 year old male here after episode of chest pain.  He awoke from sleep on the couch in an awkward position, had back pain then brief episode of chest pain that resolved with alprazolam and Tums.  Asymptomatic here in the ER.  EKG is sinus rhythm without acute ischemic changes.  Labs are overall reassuring.  Troponin negative.  Chest x-ray is clear.  Patient has remained asymptomatic here in the ED with stable vital signs.  Symptoms are somewhat atypical without prior history of cardiac disease.  Low risk heart score of 3.   I have offered him continued monitoring and delta troponin, however he is feeling well and would prefer to go home now.  Feel this is reasonable.  Will have him follow-up with his primary  care doctor.  Return here for any new/acute changes.  Final Clinical Impression(s) / ED Diagnoses Final diagnoses:  Chest pain in adult    Rx / DC Orders ED Discharge Orders    None       Larene Pickett, PA-C 04/15/19 0439    Orpah Greek, MD 04/15/19 [phone]

## 2019-04-15 NOTE — ED Triage Notes (Signed)
Pt said he was sleeping on the couch, woke up, was having back pain and heart burn, started to become short of breath. Pt says it feels very similar to when he has had an anxiety attack in the past. Pt took xanax and tumb, says that he feels better now.

## 2019-04-15 NOTE — Discharge Instructions (Signed)
Work-up today was reassuring-- normal EKG, labs, and chest x-ray. Follow-up with your primary care doctor. Return here for any new/acute changes-- recurrent chest pain, shortness of breath, sweating, dizziness, etc.

## 2019-07-26 ENCOUNTER — Encounter: Payer: Self-pay | Admitting: Gastroenterology

## 2019-08-23 ENCOUNTER — Telehealth: Payer: Self-pay | Admitting: *Deleted

## 2019-08-23 NOTE — Telephone Encounter (Signed)
Spoke with pt- he states he has not any chest discomfort since ED visit. No further issues at all.

## 2019-08-23 NOTE — Telephone Encounter (Signed)
Thank you for the note.  04/15/19 ED provider note reviewed, and they did not seem to feel that the chest pain was cardiac in nature. If he has not had chest pain since then, he can be directly booked for a screening colonoscopy.  If he has had chest pain since then, he must see primary care with note sent to [place] afterwards before colonoscopy can be scheduled.

## 2019-08-23 NOTE — Telephone Encounter (Signed)
Dr. Loletha Carrow, This pt is coming in for a screening colonoscopy on 09-13-19; his PV is 08-30-19.  He was seen in the ER on 04-15-19 for chest pain.  He was to follow up with his PCP but I do not see any visits with his PCP since then.  Is he ok to proceed with a direct colonoscopy?  Thanks, [employer]

## 2019-08-30 ENCOUNTER — Ambulatory Visit (AMBULATORY_SURGERY_CENTER): Payer: Self-pay

## 2019-08-30 ENCOUNTER — Other Ambulatory Visit: Payer: Self-pay

## 2019-08-30 VITALS — Ht 68.0 in | Wt 261.0 lb

## 2019-08-30 DIAGNOSIS — Z1211 Encounter for screening for malignant neoplasm of colon: Secondary | ICD-10-CM

## 2019-08-30 NOTE — Progress Notes (Signed)
No egg or soy allergy known to patient  No issues with past sedation with any surgeries  or procedures, no intubation problems  No diet pills per patient No home 02 use per patient  No blood thinners per patient  Pt denies issues with constipation  No A fib or A flutter  EMMI video sent to pt's e mail   Covid vaccine series completed.  Due to the COVID-19 pandemic we are asking patients to follow these guidelines. Please only bring one care partner. Please be aware that your care partner may wait in the car in the parking lot or if they feel like they will be too hot to wait in the car, they may wait in the lobby on the 4th floor. All care partners are required to wear a mask the entire time (we do not have any that we can provide them), they need to practice social distancing, and we will do a Covid check for all patient's and care partners when you arrive. Also we will check their temperature and your temperature. If the care partner waits in their car they need to stay in the parking lot the entire time and we will call them on their cell phone when the patient is ready for discharge so they can bring the car to the front of the building. Also all patient's will need to wear a mask into building.

## 2019-09-06 ENCOUNTER — Encounter: Payer: Self-pay | Admitting: Gastroenterology

## 2019-09-13 ENCOUNTER — Other Ambulatory Visit: Payer: Self-pay

## 2019-09-13 ENCOUNTER — Encounter: Payer: Self-pay | Admitting: Gastroenterology

## 2019-09-13 ENCOUNTER — Ambulatory Visit (AMBULATORY_SURGERY_CENTER): Payer: BC Managed Care – PPO | Admitting: Gastroenterology

## 2019-09-13 VITALS — BP 96/45 | HR 60 | Temp 97.3°F | Resp 19 | Ht 68.0 in | Wt 261.0 lb

## 2019-09-13 DIAGNOSIS — D123 Benign neoplasm of transverse colon: Secondary | ICD-10-CM

## 2019-09-13 DIAGNOSIS — Z1211 Encounter for screening for malignant neoplasm of colon: Secondary | ICD-10-CM | POA: Diagnosis not present

## 2019-09-13 MED ORDER — SODIUM CHLORIDE 0.9 % IV SOLN: 500.0000 mL | INTRAVENOUS | Status: DC

## 2019-09-13 NOTE — Op Note (Signed)
[facility] Patient Name: Randall Olson Procedure Date: 09/13/2019 7:56 AM MRN: [id number] Endoscopist: Mallie Mussel L. Loletha Carrow , MD Age: 54 Referring MD:  Date of Birth: 1965-03-24 Gender: Male Account #: [id number] Procedure:                Colonoscopy Indications:              Screening for colorectal malignant neoplasm, This                            is the patient's first colonoscopy Medicines:                Monitored Anesthesia Care Procedure:                Pre-Anesthesia Assessment:                           - Prior to the procedure, a History and Physical                            was performed, and patient medications and                            allergies were reviewed. The patient's tolerance of                            previous anesthesia was also reviewed. The risks                            and benefits of the procedure and the sedation                            options and risks were discussed with the patient.                            All questions were answered, and informed consent                            was obtained. Prior Anticoagulants: The patient has                            taken no previous anticoagulant or antiplatelet                            agents. ASA Grade Assessment: III - A patient with                            severe systemic disease. After reviewing the risks                            and benefits, the patient was deemed in                            satisfactory condition to undergo the procedure.  After obtaining informed consent, the colonoscope                            was passed under direct vision. Throughout the                            procedure, the patient's blood pressure, pulse, and                            oxygen saturations were monitored continuously. The                            Colonoscope was introduced through the anus and                            advanced to the the cecum,  identified by                            appendiceal orifice and ileocecal valve. The                            colonoscopy was performed without difficulty. The                            patient tolerated the procedure well. The quality                            of the bowel preparation was good. The ileocecal                            valve, appendiceal orifice, and rectum were                            photographed. The bowel preparation used was                            Miralax. Scope In: 8:04:22 AM Scope Out: 8:21:10 AM Scope Withdrawal Time: 0 hours 14 minutes 17 seconds  Total Procedure Duration: 0 hours 16 minutes 48 seconds  Findings:                 An 8-10 mm polyp was found in the transverse colon.                            The polyp was semi-sessile with a surface erosion.                            The polyp was removed with a cold snare. Resection                            and retrieval were complete.                           The exam was otherwise without abnormality on  direct and retroflexion views. Complications:            No immediate complications. Estimated Blood Loss:     Estimated blood loss was minimal. Impression:               - One 8-10 mm polyp in the transverse colon,                            removed with a cold snare. Resected and retrieved.                           - The examination was otherwise normal on direct                            and retroflexion views. Recommendation:           - Patient has a contact number available for                            emergencies. The signs and symptoms of potential                            delayed complications were discussed with the                            patient. Return to normal activities tomorrow.                            Written discharge instructions were provided to the                            patient.                           - Resume previous diet.                            - Continue present medications.                           - Await pathology results.                           - Repeat colonoscopy is recommended for                            surveillance. The colonoscopy date will be                            determined after pathology results from today's                            exam become available for review. Trixy Loyola L. Loletha Carrow, MD 09/13/2019 8:24:50 AM This report has been signed electronically.

## 2019-09-13 NOTE — Progress Notes (Signed)
Called to room to assist during endoscopic procedure.  Patient ID and intended procedure confirmed with present staff. Received instructions for my participation in the procedure from the performing physician.  

## 2019-09-13 NOTE — Progress Notes (Signed)
Vs Kw I have reviewed the patient's medical history in detail and updated the computerized patient record.

## 2019-09-13 NOTE — Patient Instructions (Signed)
YOU HAD AN ENDOSCOPIC PROCEDURE TODAY AT THE [facility]:   Refer to the procedure report that was given to you for any specific questions about what was found during the examination.  If the procedure report does not answer your questions, please call your gastroenterologist to clarify.  If you requested that your care partner not be given the details of your procedure findings, then the procedure report has been included in a sealed envelope for you to review at your convenience later.  YOU SHOULD EXPECT: Some feelings of bloating in the abdomen. Passage of more gas than usual.  Walking can help get rid of the air that was put into your GI tract during the procedure and reduce the bloating. If you had a lower endoscopy (such as a colonoscopy or flexible sigmoidoscopy) you may notice spotting of blood in your stool or on the toilet paper. If you underwent a bowel prep for your procedure, you may not have a normal bowel movement for a few days.  Please Note:  You might notice some irritation and congestion in your nose or some drainage.  This is from the oxygen used during your procedure.  There is no need for concern and it should clear up in a day or so.  SYMPTOMS TO REPORT IMMEDIATELY:   Following lower endoscopy (colonoscopy or flexible sigmoidoscopy):  Excessive amounts of blood in the stool  Significant tenderness or worsening of abdominal pains  Swelling of the abdomen that is new, acute  Fever of 100F or higher  For urgent or emergent issues, a gastroenterologist can be reached at any hour by calling [phone]. Do not use MyChart messaging for urgent concerns.    DIET:  We do recommend a small meal at first, but then you may proceed to your regular diet.  Drink plenty of fluids but you should avoid alcoholic beverages for 24 hours.  ACTIVITY:  You should plan to take it easy for the rest of today and you should NOT DRIVE or use heavy machinery until tomorrow (because  of the sedation medicines used during the test).    FOLLOW UP: Our staff will call the number listed on your records 48-72 hours following your procedure to check on you and address any questions or concerns that you may have regarding the information given to you following your procedure. If we do not reach you, we will leave a message.  We will attempt to reach you two times.  During this call, we will ask if you have developed any symptoms of COVID 19. If you develop any symptoms (ie: fever, flu-like symptoms, shortness of breath, cough etc.) before then, please call [phone].  If you test positive for Covid 19 in the 2 weeks post procedure, please call and report this information to us.    If any biopsies were taken you will be contacted by phone or by letter within the next 1-3 weeks.  Please call us at [phone] if you have not heard about the biopsies in 3 weeks.    SIGNATURES/CONFIDENTIALITY: You and/or your care partner have signed paperwork which will be entered into your electronic medical record.  These signatures attest to the fact that that the information above on your After Visit Summary has been reviewed and is understood.  Full responsibility of the confidentiality of this discharge information lies with you and/or your care-partner. 

## 2019-09-13 NOTE — Progress Notes (Signed)
Pt Drowsy. VSS. To PACU, report to RN. No anesthetic complications noted.  

## 2019-09-17 ENCOUNTER — Telehealth: Payer: Self-pay

## 2019-09-17 NOTE — Telephone Encounter (Signed)
  Follow up Call-  Call back number 09/13/2019  Post procedure Call Back phone  # [phone]  Permission to leave phone message Yes  Some recent data might be hidden     Patient questions:  Do you have a fever, pain , or abdominal swelling? No. Pain Score  0 *  Have you tolerated food without any problems? Yes.    Have you been able to return to your normal activities? Yes.    Do you have any questions about your discharge instructions: Diet   No. Medications  No. Follow up visit  No.  Do you have questions or concerns about your Care? No.  Actions: * If pain score is 4 or above: No action needed, pain <4.  1. Have you developed a fever since your procedure? no  2.   Have you had an respiratory symptoms (SOB or cough) since your procedure? no  3.   Have you tested positive for COVID 19 since your procedure no  4.   Have you had any family members/close contacts diagnosed with the COVID 19 since your procedure?  no   If yes to any of these questions please route to Joylene John, RN and Erenest Rasher, RN

## 2019-09-21 ENCOUNTER — Encounter: Payer: Self-pay | Admitting: Gastroenterology

## 2019-12-25 ENCOUNTER — Other Ambulatory Visit: Payer: Self-pay | Admitting: Internal Medicine

## 2019-12-25 DIAGNOSIS — I10 Essential (primary) hypertension: Secondary | ICD-10-CM

## 2019-12-25 DIAGNOSIS — U071 COVID-19: Secondary | ICD-10-CM

## 2019-12-25 DIAGNOSIS — E119 Type 2 diabetes mellitus without complications: Secondary | ICD-10-CM

## 2019-12-25 NOTE — Progress Notes (Signed)
I connected by phone with [name] on 12/25/2019 at 4:02 PM to discuss the potential use of a new treatment for mild to moderate COVID-19 viral infection in non-hospitalized patients.  This patient is a 54 y.o. male that meets the FDA criteria for Emergency Use Authorization of COVID monoclonal antibody casirivimab/imdevimab or bamlanivimab/eteseviamb.  Has a (+) direct SARS-CoV-2 viral test result  Has mild or moderate COVID-19   Is NOT hospitalized due to COVID-19  Is within 10 days of symptom onset  Has at least one of the high risk factor(s) for progression to severe COVID-19 and/or hospitalization as defined in EUA.  Specific high risk criteria : Diabetes   I have spoken and communicated the following to the patient or parent/caregiver regarding COVID monoclonal antibody treatment:  1. FDA has authorized the emergency use for the treatment of mild to moderate COVID-19 in adults and pediatric patients with positive results of direct SARS-CoV-2 viral testing who are 40 years of age and older weighing at least 40 kg, and who are at high risk for progressing to severe COVID-19 and/or hospitalization.  2. The significant known and potential risks and benefits of COVID monoclonal antibody, and the extent to which such potential risks and benefits are unknown.  3. Information on available alternative treatments and the risks and benefits of those alternatives, including clinical trials.  4. Patients treated with COVID monoclonal antibody should continue to self-isolate and use infection control measures (e.g., wear mask, isolate, social distance, avoid sharing personal items, clean and disinfect "high touch" surfaces, and frequent handwashing) according to CDC guidelines.   5. The patient or parent/caregiver has the option to accept or refuse COVID monoclonal antibody treatment.  After reviewing this information with the patient, the patient has agreed to receive one of the available  covid 19 monoclonal antibodies and will be provided an appropriate fact sheet prior to infusion.  Alan Ripper, [place]

## 2019-12-26 ENCOUNTER — Ambulatory Visit (HOSPITAL_COMMUNITY)
Admission: RE | Admit: 2019-12-26 | Discharge: 2019-12-26 | Disposition: A | Discharge status: Home / Self Care | Payer: BC Managed Care – PPO | Source: Ambulatory Visit | Attending: Pulmonary Disease | Admitting: Pulmonary Disease

## 2019-12-26 DIAGNOSIS — I10 Essential (primary) hypertension: Secondary | ICD-10-CM | POA: Diagnosis present

## 2019-12-26 DIAGNOSIS — E119 Type 2 diabetes mellitus without complications: Secondary | ICD-10-CM | POA: Diagnosis present

## 2019-12-26 DIAGNOSIS — U071 COVID-19: Secondary | ICD-10-CM | POA: Diagnosis present

## 2019-12-26 MED ORDER — EPINEPHRINE 0.3 MG/0.3ML IJ SOAJ: 0.3000 mg | Freq: Once | INTRAMUSCULAR | Status: DC | PRN

## 2019-12-26 MED ORDER — DIPHENHYDRAMINE HCL 50 MG/ML IJ SOLN: 50.0000 mg | Freq: Once | INTRAMUSCULAR | Status: DC | PRN

## 2019-12-26 MED ORDER — ALBUTEROL SULFATE HFA 108 (90 BASE) MCG/ACT IN AERS: 2.0000 | INHALATION_SPRAY | Freq: Once | RESPIRATORY_TRACT | Status: DC | PRN

## 2019-12-26 MED ORDER — FAMOTIDINE IN NACL 20-0.9 MG/50ML-% IV SOLN: 20.0000 mg | Freq: Once | INTRAVENOUS | Status: DC | PRN

## 2019-12-26 MED ORDER — SODIUM CHLORIDE 0.9 % IV SOLN: INTRAVENOUS | Status: DC | PRN

## 2019-12-26 MED ORDER — METHYLPREDNISOLONE SODIUM SUCC 125 MG IJ SOLR: 125.0000 mg | Freq: Once | INTRAMUSCULAR | Status: DC | PRN

## 2019-12-26 MED ORDER — SODIUM CHLORIDE 0.9 % IV SOLN
1200.0000 mg | Freq: Once | INTRAVENOUS | Status: AC
  Administered 2019-12-26: 1200 mg via INTRAVENOUS

## 2019-12-26 NOTE — Progress Notes (Signed)
  Diagnosis: COVID-19  Physician: Asencion Noble  Procedure: Covid Infusion Clinic Med: casirivimab\imdevimab infusion - Provided patient with casirivimab\imdevimab fact sheet for patients, parents and caregivers prior to infusion.  Complications: No immediate complications noted.  Discharge: Discharged home   [place] 12/26/2019

## 2019-12-26 NOTE — Discharge Instructions (Signed)

## 2024-01-17 ENCOUNTER — Other Ambulatory Visit: Payer: Self-pay | Admitting: General Surgery

## 2024-01-17 DIAGNOSIS — K432 Incisional hernia without obstruction or gangrene: Secondary | ICD-10-CM

## 2024-01-23 ENCOUNTER — Ambulatory Visit
Admission: RE | Admit: 2024-01-23 | Discharge: 2024-01-23 | Disposition: A | Discharge status: Home / Self Care | Source: Ambulatory Visit | Attending: General Surgery | Admitting: General Surgery

## 2024-01-23 DIAGNOSIS — K432 Incisional hernia without obstruction or gangrene: Secondary | ICD-10-CM

## 2024-01-23 MED ORDER — IOPAMIDOL (ISOVUE-300) INJECTION 61%
100.0000 mL | Freq: Once | INTRAVENOUS | Status: AC | PRN
  Administered 2024-01-23: 100 mL via INTRAVENOUS

## 2024-02-10 ENCOUNTER — Ambulatory Visit: Payer: Self-pay | Admitting: General Surgery

## 2024-03-28 ENCOUNTER — Ambulatory Visit (HOSPITAL_BASED_OUTPATIENT_CLINIC_OR_DEPARTMENT_OTHER)
Admission: RE | Admit: 2024-03-28 | Discharge: 2024-03-28 | Disposition: A | Discharge status: Home / Self Care | Source: Ambulatory Visit | Attending: Internal Medicine | Admitting: Internal Medicine

## 2024-03-28 ENCOUNTER — Other Ambulatory Visit (HOSPITAL_BASED_OUTPATIENT_CLINIC_OR_DEPARTMENT_OTHER): Payer: Self-pay | Admitting: Internal Medicine

## 2024-03-28 DIAGNOSIS — R899 Unspecified abnormal finding in specimens from other organs, systems and tissues: Secondary | ICD-10-CM | POA: Insufficient documentation
